# Patient Record
Sex: Female | Born: 1935 | Race: White | Hispanic: No | Smoking: Never smoker
Health system: Southern US, Community
[De-identification: ages and names within clinical notes are randomized; demographics above are authoritative.]

## PROBLEM LIST (undated history)

## (undated) DIAGNOSIS — E079 Disorder of thyroid, unspecified: Secondary | ICD-10-CM

## (undated) DIAGNOSIS — E7800 Pure hypercholesterolemia, unspecified: Secondary | ICD-10-CM

## (undated) DIAGNOSIS — I10 Essential (primary) hypertension: Secondary | ICD-10-CM

## (undated) HISTORY — PX: KNEE ARTHROSCOPY: SUR90

## (undated) HISTORY — PX: BACK SURGERY: SHX140

---

## 2014-06-23 ENCOUNTER — Emergency Department (HOSPITAL_BASED_OUTPATIENT_CLINIC_OR_DEPARTMENT_OTHER): Payer: Medicare PPO

## 2014-06-23 ENCOUNTER — Encounter (HOSPITAL_BASED_OUTPATIENT_CLINIC_OR_DEPARTMENT_OTHER): Payer: Self-pay | Admitting: *Deleted

## 2014-06-23 ENCOUNTER — Emergency Department (HOSPITAL_BASED_OUTPATIENT_CLINIC_OR_DEPARTMENT_OTHER)
Admission: EM | Admit: 2014-06-23 | Discharge: 2014-06-23 | Disposition: A | Discharge status: Other Acute Inpatient Hospital | Payer: Medicare PPO | Attending: Emergency Medicine | Admitting: Emergency Medicine

## 2014-06-23 DIAGNOSIS — S67194A Crushing injury of right ring finger, initial encounter: Secondary | ICD-10-CM | POA: Diagnosis not present

## 2014-06-23 DIAGNOSIS — Y929 Unspecified place or not applicable: Secondary | ICD-10-CM | POA: Insufficient documentation

## 2014-06-23 DIAGNOSIS — Y998 Other external cause status: Secondary | ICD-10-CM | POA: Diagnosis not present

## 2014-06-23 DIAGNOSIS — Y9389 Activity, other specified: Secondary | ICD-10-CM | POA: Insufficient documentation

## 2014-06-23 DIAGNOSIS — W230XXA Caught, crushed, jammed, or pinched between moving objects, initial encounter: Secondary | ICD-10-CM | POA: Diagnosis not present

## 2014-06-23 DIAGNOSIS — S61219A Laceration without foreign body of unspecified finger without damage to nail, initial encounter: Secondary | ICD-10-CM

## 2014-06-23 DIAGNOSIS — I10 Essential (primary) hypertension: Secondary | ICD-10-CM | POA: Diagnosis not present

## 2014-06-23 DIAGNOSIS — S62664A Nondisplaced fracture of distal phalanx of right ring finger, initial encounter for closed fracture: Secondary | ICD-10-CM | POA: Insufficient documentation

## 2014-06-23 HISTORY — DX: Disorder of thyroid, unspecified: E07.9

## 2014-06-23 HISTORY — DX: Essential (primary) hypertension: I10

## 2014-06-23 HISTORY — DX: Pure hypercholesterolemia, unspecified: E78.00

## 2014-06-23 MED ORDER — HYDROCODONE-ACETAMINOPHEN 5-325 MG PO TABS: 1.0000 | ORAL_TABLET | Freq: Four times a day (QID) | ORAL | Status: AC | PRN

## 2014-06-23 MED ORDER — LIDOCAINE HCL (PF) 1 % IJ SOLN
10.0000 mL | Freq: Once | INTRAMUSCULAR | Status: AC
  Administered 2014-06-23: 10 mL
  Filled 2014-06-23: qty 10

## 2014-06-23 MED ORDER — BUPIVACAINE HCL 0.5 % IJ SOLN
50.0000 mL | Freq: Once | INTRAMUSCULAR | Status: AC
  Administered 2014-06-23: 50 mL
  Filled 2014-06-23: qty 1

## 2014-06-23 MED ORDER — SULFAMETHOXAZOLE-TRIMETHOPRIM 800-160 MG PO TABS
1.0000 | ORAL_TABLET | Freq: Two times a day (BID) | ORAL | Status: AC
Start: 2014-06-23 — End: ?

## 2014-06-23 MED ORDER — TETANUS-DIPHTH-ACELL PERTUSSIS 5-2.5-18.5 LF-MCG/0.5 IM SUSP
0.5000 mL | Freq: Once | INTRAMUSCULAR | Status: AC
  Administered 2014-06-23: 0.5 mL via INTRAMUSCULAR
  Filled 2014-06-23: qty 0.5

## 2014-06-23 NOTE — Consult Note (Signed)
Reason for Consult: Ring finger fracture right hand with open distal phalanx and nailbed injury Referring Physician: [facility]  Christina James is an 81 y.o. female.  HPI: Patient presents for evaluation. She is a 81 years of age. Check crush injury to her finger today and has no fracture nail bed injury no plate injury and disarray the soft tissues. She denies other complaints she is alert and oriented. She is here with 2 delightful friends  Past Medical History  Diagnosis Date  . Hypertension   . Hypercholesteremia   . Thyroid disease     Past Surgical History  Procedure Laterality Date  . Back surgery    . Knee arthroscopy      No family history on file.  Social History:  reports that she has never smoked. She does not have any smokeless tobacco history on file. She reports that she does not drink alcohol or use illicit drugs.  Allergies:  Allergies  Allergen Reactions  . Augmentin [Amoxicillin-Pot Clavulanate] Other (See Comments)  . Lotensin [Benazepril Hcl] Swelling    Medications: I have reviewed the patient's current medications.  No results found for this or any previous visit (from the past 48 hour(s)).  Dg Finger Ring Right  06/23/2014   CLINICAL DATA:  Right ring finger recently smashed in a door. Laceration of the distal phalanx.  EXAM: RIGHT RING FINGER 2+V  COMPARISON:  None.  FINDINGS: Right fourth distal phalanx soft tissue laceration with underlying comminution of the tip of the fourth distal phalanx consistent with an open fracture.  There is no other fracture or dislocation.  IMPRESSION: Right fourth distal phalanx soft tissue laceration with underlying comminution of the tip of the fourth distal phalanx consistent with an open fracture.   Electronically Signed   By: Elige Ko   On: 06/23/2014 13:47    Review of Systems  Constitutional: Negative.   Eyes: Negative.   Cardiovascular: Negative.   Gastrointestinal: Negative.    Genitourinary: Negative.   Neurological: Negative.    Blood pressure 156/82, pulse 65, temperature 97.7 F (36.5 C), temperature source Oral, resp. rate 18, SpO2 95 %. Physical Exam Open right ring finger distal phalanx fracture with nail bed nail plate injury. There is marked disarray the soft tissues. She has normal refill to the tip.  The patient is alert and oriented in no acute distress the patient complains of pain in the affected upper extremity.  The patient is noted to have a normal HEENT exam.  Lung fields show equal chest expansion and no shortness of breath  abdomen exam is nontender without distention.  Lower extremity examination does not show any fracture dislocation or blood clot symptoms.  Pelvis is stable neck and back are stable and nontender       Assessment/Plan: Open distal phalanx fracture with nailbed and nail plate injury right ring finger We will plan for I and D and repair of your finger.    Procedure patient was seen and given an intermetacarpal block following issues prepped and draped Betadine scrub and paint 2 separate scrubs irrigation was applied and irrigation and debridement of skin subcutaneous tissue and tendon type tissue as well as bone was accomplished this was an excisional debridement with curette knife blade and scissor tip. Patient tolerated this well. Following this the patient then underwent open treatment of the distal phalanx fracture with setting technique.  Following this the nail plate was removed and and complex nailbed repair with fine 6-0 chromic  was accomplished. She tolerated this quite well  Patient tolerated this well. Adaptic was placed under the eponychial fold. The wound was dressed nicely.  Thus the patient underwent I and D of an open fracture, open treatment of distal phalanx fracture, nail plate removal, nailbed repair, and skin margin repair less than a centimeter  She'll be discharged him on Bactrim DS 2 weeks as  well as Norco when necessary pain cessation a week for follow-up exam shows a problems occur will be immediately available  It was a pleasure to see her today  Keep bandage clean and dry.  Call for any problems.  No smoking.  Criteria for driving a car: you should be off your pain medicine for 7-8 hours, able to drive one handed(confident), thinking clearly and feeling able in your judgement to drive. Continue elevation as it will decrease swelling.  If instructed by MD move your fingers within the confines of the bandage/splint.  Use ice if instructed by your MD. Call immediately for any sudden loss of feeling in your hand/arm or change in functional abilities of the extremity.  We recommend that you to take vitamin C 1000 mg a day to promote healing we also recommend that if you require her pain medicine that he take a stool softener to prevent constipation as most pain medicines will have constipation side effects. We recommend either Peri-Colace or Senokot and recommend that you also consider adding MiraLAX to prevent the constipation affects from pain medicine if you are required to use them. These medicines are over the counter and maybe purchased at a local pharmacy.  Karen ChafeGRAMIG III,Riven Beebe M 06/23/2014, 6:55 PM

## 2014-06-23 NOTE — Discharge Instructions (Signed)
Keep wound and clean. Keep area covered and dry, and do not submerge in water for 24 hours. Ice and elevate for additional pain relief and swelling. Alternate between tylenol or norco for additional pain relief. Take all of antibiotic as directed. Follow up with Dr. Amanda Pea in 1 week. Monitor area for signs of infection to include, but not limited to: increasing pain, redness, drainage/pus, or swelling. Return to emergency department for emergent changing or worsening symptoms.    Fingertip Injuries and Amputations Fingertip injuries are common and often get injured because they are last to escape when pulling your hand out of harm's way. You have amputated (cut off) part of your finger. How this turns out depends largely on how much was amputated. If just the tip is amputated, often the end of the finger will grow back and the finger may return to much the same as it was before the injury.  If more of the finger is missing, your caregiver has done the best with the tissue remaining to allow you to keep as much finger as is possible. Your caregiver after checking your injury has tried to leave you with a painless fingertip that has durable, feeling skin. If possible, your caregiver has tried to maintain the finger's length and appearance and preserve its fingernail.  Please read the instructions outlined below and refer to this sheet in the next few weeks. These instructions provide you with general information on caring for yourself. Your caregiver may also give you specific instructions. While your treatment has been done according to the most current medical practices available, unavoidable complications occasionally occur. If you have any problems or questions after discharge, please call your caregiver. HOME CARE INSTRUCTIONS   You may resume normal diet and activities as directed or allowed.  Keep your hand elevated above the level of your heart. This helps decrease pain and swelling.  Keep ice  packs (or a bag of ice wrapped in a towel) on the injured area for 15-20 minutes, 03-04 times per day, for the first two days.  Change dressings if necessary or as directed.  Clean the wound daily or as directed.  Only take over-the-counter or prescription medicines for pain, discomfort, or fever as directed by your caregiver.  Keep appointments as directed. SEEK IMMEDIATE MEDICAL CARE IF:  You develop redness, swelling, numbness or increasing pain in the wound.  There is pus coming from the wound.  You develop an unexplained oral temperature above 102 F (38.9 C) or as your caregiver suggests.  There is a foul (bad) smell coming from the wound or dressing.  There is a breaking open of the wound (edges not staying together) after sutures or staples have been removed. MAKE SURE YOU:   Understand these instructions.  Will watch your condition.  Will get help right away if you are not doing well or get worse. Document Released: 02/17/2005 Document Revised: 06/21/2011 Document Reviewed: 01/17/2008 [facility] Patient Information 2015 [place], [place]. This information is not intended to replace advice given to you by your health care provider. Make sure you discuss any questions you have with your health care provider.  Laceration Care, Adult A laceration is a cut that goes through all layers of the skin. The cut goes into the tissue beneath the skin. HOME CARE For stitches (sutures) or staples:  Keep the cut clean and dry.  If you have a bandage (dressing), change it at least once a day. Change the bandage if it gets wet or  dirty, or as told by your doctor.  Wash the cut with soap and water 2 times a day. Rinse the cut with water. Pat it dry with a clean towel.  Put a thin layer of medicated cream on the cut as told by your doctor.  You may shower after the first 24 hours. Do not soak the cut in water until the stitches are removed.  Only take medicines as told by your  doctor.  Have your stitches or staples removed as told by your doctor. For skin adhesive strips:  Keep the cut clean and dry.  Do not get the strips wet. You may take a bath, but be careful to keep the cut dry.  If the cut gets wet, pat it dry with a clean towel.  The strips will fall off on their own. Do not remove the strips that are still stuck to the cut. For wound glue:  You may shower or take baths. Do not soak or scrub the cut. Do not swim. Avoid heavy sweating until the glue falls off on its own. After a shower or bath, pat the cut dry with a clean towel.  Do not put medicine on your cut until the glue falls off.  If you have a bandage, do not put tape over the glue.  Avoid lots of sunlight or tanning lamps until the glue falls off. Put sunscreen on the cut for the first year to reduce your scar.  The glue will fall off on its own. Do not pick at the glue. You may need a tetanus shot if:  You cannot remember when you had your last tetanus shot.  You have never had a tetanus shot. If you need a tetanus shot and you choose not to have one, you may get tetanus. Sickness from tetanus can be serious. GET HELP RIGHT AWAY IF:   Your pain does not get better with medicine.  Your arm, hand, leg, or foot loses feeling (numbness) or changes color.  Your cut is bleeding.  Your joint feels weak, or you cannot use your joint.  You have painful lumps on your body.  Your cut is red, puffy (swollen), or painful.  You have a red line on the skin near the cut.  You have yellowish-white fluid (pus) coming from the cut.  You have a fever.  You have a bad smell coming from the cut or bandage.  Your cut breaks open before or after stitches are removed.  You notice something coming out of the cut, such as wood or glass.  You cannot move a finger or toe. MAKE SURE YOU:   Understand these instructions.  Will watch your condition.  Will get help right away if you are not  doing well or get worse. Document Released: 09/15/2007 Document Revised: 06/21/2011 Document Reviewed: 09/22/2010 [facility] Patient Information 2015 [place]. This information is not intended to replace advice given to you by your health care provider. Make sure you discuss any questions you have with your health care provider.

## 2014-06-23 NOTE — ED Provider Notes (Signed)
MSE was initiated and I personally evaluated the patient and placed orders (if any) at  4:59 PM on June 23, 2014.  The patient appears stable so that the remainder of the MSE may be completed by another provider.  [facility] Amanda Pea[facility]ramig [facility]. Pt stable at this time. Will continue to monitor.  6:45 PM Dr. Amanda PeaGramig finished with care. Asked that I prescribe pt bactrim and norco with 1wk follow up. Has instructed pt on care of finger. Please see his consult note for further documentation of care.  BP 156/82 mmHg  Pulse 65  Temp(Src) 97.7 F (36.5 C) (Oral)  Resp 18  SpO2 95%  Meds ordered this encounter  Medications  . Tdap (BOOSTRIX) injection 0.5 mL    Sig:   . bupivacaine (MARCAINE) 0.5 % (with pres) injection 50 mL    Sig:   . lidocaine (PF) (XYLOCAINE) 1 % injection 10 mL    Sig:   . sulfamethoxazole-trimethoprim (BACTRIM DS,SEPTRA DS) 800-160 MG per tablet    Sig: Take 1 tablet by mouth 2 (two) times daily.    Dispense:  14 tablet    Refill:  0    Order Specific Question:  Supervising Provider    Answer:  MILLER, BRIAN [[id number]]  . HYDROcodone-acetaminophen (NORCO) 5-325 MG per tablet    Sig: Take 1 tablet by mouth every 6 (six) hours as needed for severe pain.    Dispense:  20 tablet    Refill:  0    Order Specific Question:  Supervising Provider    Answer:  Eber HongMILLER, BRIAN [[id number]]     Jakyiah Briones Camprubi-Soms, PA-C 06/23/14 1848  Blake DivineJohn Wofford, MD 06/24/14 [id number]

## 2014-06-23 NOTE — ED Notes (Signed)
Patient got her right ring finger shut in a bathroom door.

## 2014-06-23 NOTE — ED Notes (Signed)
MD at bedside. 

## 2014-06-23 NOTE — ED Provider Notes (Signed)
CSN: [id number]     Arrival date & time 06/23/14  1242 History   First MD Initiated Contact with Patient 06/23/14 1313     Chief Complaint  Patient presents with  . Extremity Laceration     (Consider location/radiation/quality/duration/timing/severity/associated sxs/prior Treatment) HPI Christina James is a 81 y.o. female with history of hypertension, thyroid disease, presents to emergency department complaining of right ring finger injury. Patient states she closed her finger in the bathroom door. She reports bleeding, deformity to the fingernail. Pressure was applied by patient and her friend and they brought her to here to emergency department. She states pain is constant, sharp, worsened with any palpation of the finger. Denies any other injuries. Unsure of her tetanus.  Past Medical History  Diagnosis Date  . Hypertension   . Hypercholesteremia   . Thyroid disease    Past Surgical History  Procedure Laterality Date  . Back surgery    . Knee arthroscopy     No family history on file. History  Substance Use Topics  . Smoking status: Never Smoker   . Smokeless tobacco: Not on file  . Alcohol Use: No   OB History    No data available     Review of Systems  Musculoskeletal: Positive for arthralgias.  Skin: Positive for wound.  Neurological: Negative for weakness and numbness.      Allergies  Augmentin and Lotensin  Home Medications   Prior to Admission medications   Medication Sig Start Date End Date Taking? Authorizing Provider  ALPRAZolam Prudy Feeler) 0.25 MG tablet Take 0.25 mg by mouth at bedtime as needed for anxiety.   Yes Historical Provider, MD  aspirin 81 MG tablet Take 81 mg by mouth daily.   Yes Historical Provider, MD  furosemide (LASIX) 20 MG tablet Take 20 mg by mouth.   Yes Historical Provider, MD  indapamide (LOZOL) 2.5 MG tablet Take 2.5 mg by mouth daily.   Yes Historical Provider, MD  levothyroxine (SYNTHROID, LEVOTHROID) 75 MCG tablet Take 75  mcg by mouth daily before breakfast.   Yes Historical Provider, MD  metoprolol succinate (TOPROL-XL) 100 MG 24 hr tablet Take 100 mg by mouth daily. Take with or immediately following a meal.   Yes Historical Provider, MD  metoprolol succinate (TOPROL-XL) 50 MG 24 hr tablet Take 50 mg by mouth daily. Take with or immediately following a meal.   Yes Historical Provider, MD  omeprazole (PRILOSEC) 40 MG capsule Take 40 mg by mouth daily.   Yes Historical Provider, MD  potassium chloride (KLOR-CON) 20 MEQ packet Take by mouth 2 (two) times daily.   Yes Historical Provider, MD  simvastatin (ZOCOR) 20 MG tablet Take 20 mg by mouth daily.   Yes Historical Provider, MD   BP 139/69 mmHg  Pulse 62  Temp(Src) 97.7 F (36.5 C) (Oral)  Resp 18  SpO2 96% Physical Exam  Constitutional: She appears well-developed and well-nourished. No distress.  Eyes: Conjunctivae are normal.  Neck: Neck supple.  Musculoskeletal:  Laceration to the distal phalanx of the right ring finger. Laceration extends through distal 1/3 of the finger nail, and around the bilateral sides.   Neurological: She is alert.  Skin: Skin is warm and dry.  Nursing note and vitals reviewed.   ED Course  Procedures (including critical care time) Labs Review Labs Reviewed - No data to display  Imaging Review Dg Finger Ring Right  06/23/2014   CLINICAL DATA:  Right ring finger recently smashed in a door. Laceration of  the distal phalanx.  EXAM: RIGHT RING FINGER 2+V  COMPARISON:  None.  FINDINGS: Right fourth distal phalanx soft tissue laceration with underlying comminution of the tip of the fourth distal phalanx consistent with an open fracture.  There is no other fracture or dislocation.  IMPRESSION: Right fourth distal phalanx soft tissue laceration with underlying comminution of the tip of the fourth distal phalanx consistent with an open fracture.   Electronically Signed   By: Elige KoHetal  Patel   On: 06/23/2014 13:47     EKG  Interpretation None      NERVE BLOCK Performed by: Jaynie CrumbleKIRICHENKO, Jisel Fleet A Consent: Verbal consent obtained. Required items: required blood products, implants, devices, and special equipment available Time out: Immediately prior to procedure a "time out" was called to verify the correct patient, procedure, equipment, support staff and site/side marked as required.  Indication: laceration of finger Nerve block body site: right ring finger  Preparation: Patient was prepped and draped in the usual sterile fashion. Needle gauge: 24 G Location technique: anatomical landmarks  Local anesthetic: bupivacaine 0.5%  Anesthetic total: 4 ml  Outcome: pain improved Patient tolerance: Patient tolerated the procedure well with no immediate complications.    MDM   Final diagnoses:  Finger laceration, initial encounter    Patient with laceration through the distal phalanx of the right ring finger, nail involvement. Wound appears to be almost like a partial avulsion of the distal finger. X-ray showing comminuted fracture of the tuft of the finger. Digital block performed for pain control. Tetanus updated. Discussed with Dr. Cheree DittoGraham egg with hand surgery [facility] and will evaluate and treat further.   Filed Vitals:   06/23/14 1459 06/23/14 1603 06/23/14 1605 06/23/14 1857  BP: 134/69  156/82 124/67  Pulse: 61 65  64  Temp: 97.7 F (36.5 C)   97.9 F (36.6 C)  TempSrc: Oral   Oral  Resp: 18   18  SpO2: 99% 95%  97%     Jaynie Crumbleatyana Tonette Koehne, PA-C 06/24/14 0051  Rolland PorterMark James, MD 06/29/14 2220

## 2014-06-23 NOTE — ED Notes (Signed)
Declined W/C at D/C and was escorted to lobby by RN. 

## 2016-02-16 IMAGING — DX DG FINGER RING 2+V*R*
3 series · 3 of 3 positions shown · non-contrast
Comparison: None.

CLINICAL DATA: Right ring finger recently smashed in a door.
Laceration of the distal phalanx.

EXAM:
RIGHT RING FINGER 2+V

[finger ap]
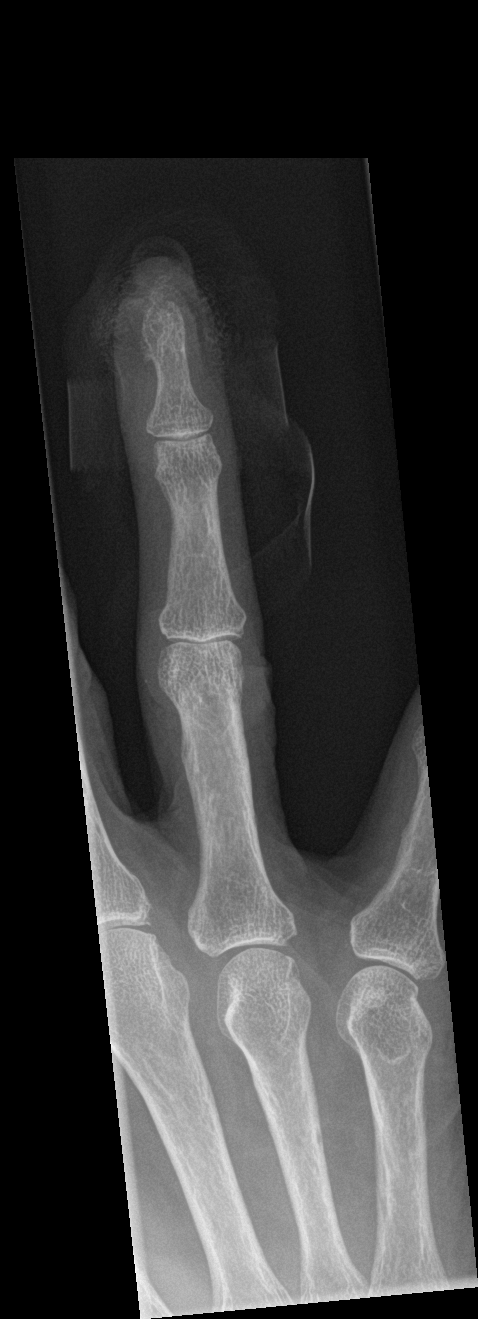

[finger obl]
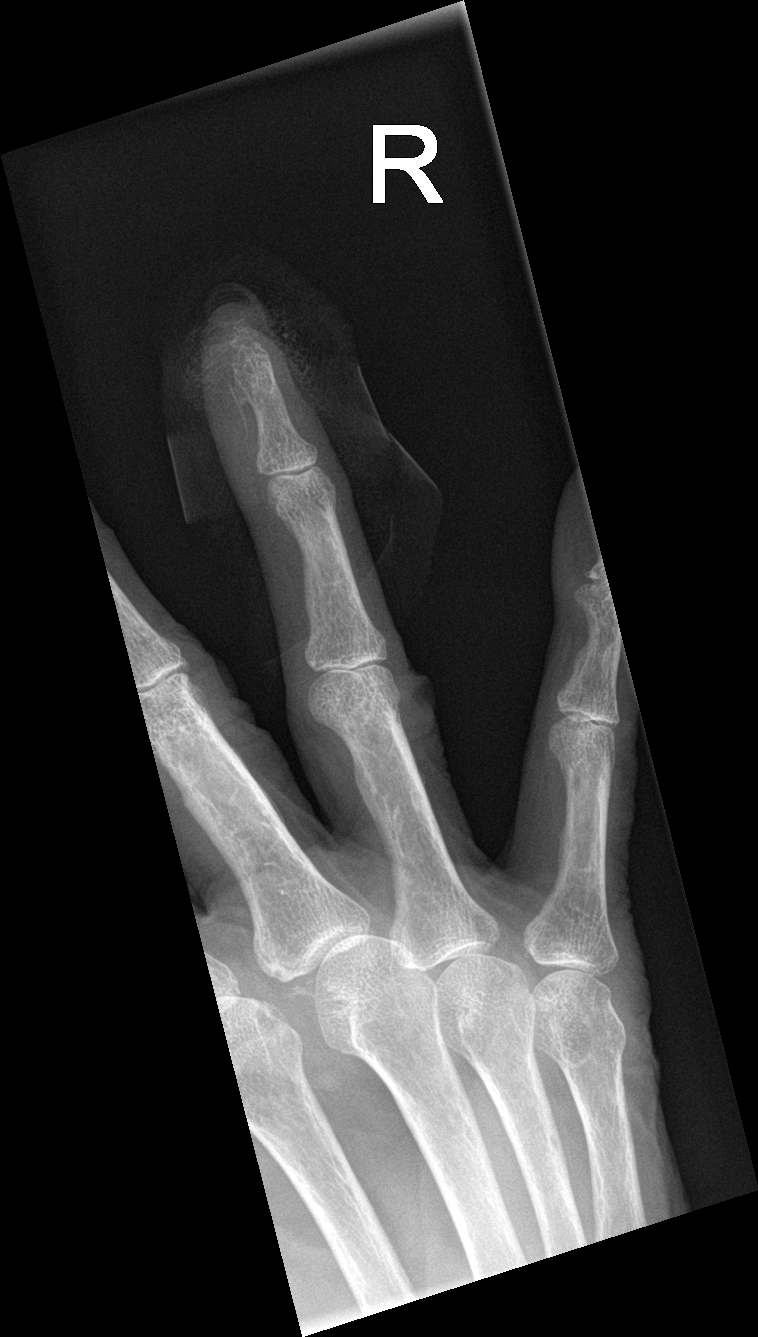

[finger lat]
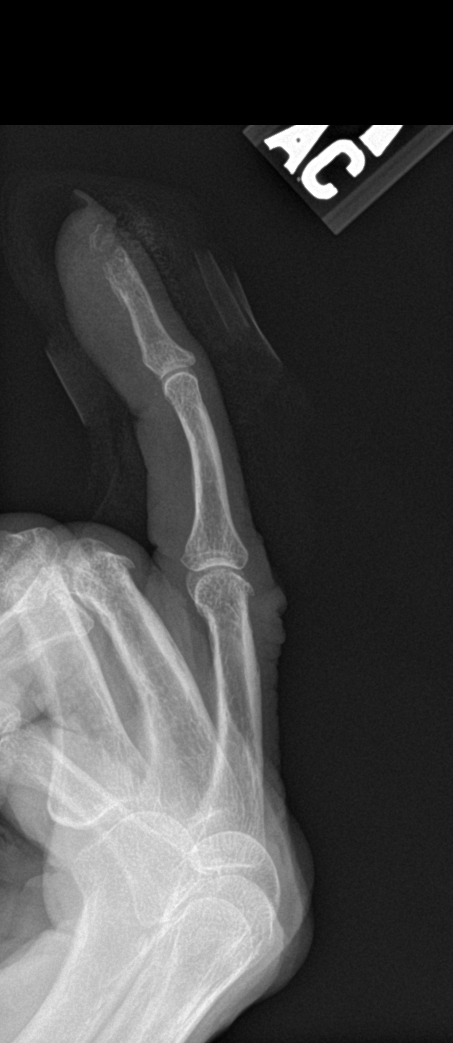

[3 of 3 positions shown; findings below may reference images not displayed]

FINDINGS: Right fourth distal phalanx soft tissue laceration with underlying
comminution of the tip of the fourth distal phalanx consistent with
an open fracture.

There is no other fracture or dislocation.
IMPRESSION: Right fourth distal phalanx soft tissue laceration with underlying
comminution of the tip of the fourth distal phalanx consistent with
an open fracture.

## 2021-01-10 DEATH — deceased
